# Patient Record
Sex: Female | Born: 1965 | Race: Black or African American | Hispanic: No | Marital: Married | Smoking: Never smoker
Health system: Southern US, Community
[De-identification: ages and names within clinical notes are randomized; demographics above are authoritative.]

## PROBLEM LIST (undated history)

## (undated) DIAGNOSIS — Z8719 Personal history of other diseases of the digestive system: Secondary | ICD-10-CM

## (undated) DIAGNOSIS — IMO0002 Reserved for concepts with insufficient information to code with codable children: Secondary | ICD-10-CM

## (undated) DIAGNOSIS — R200 Anesthesia of skin: Secondary | ICD-10-CM

## (undated) DIAGNOSIS — R202 Paresthesia of skin: Secondary | ICD-10-CM

## (undated) HISTORY — DX: Reserved for concepts with insufficient information to code with codable children: IMO0002

## (undated) HISTORY — DX: Personal history of other diseases of the digestive system: Z87.19

## (undated) HISTORY — DX: Anesthesia of skin: R20.2

## (undated) HISTORY — DX: Anesthesia of skin: R20.0

## (undated) HISTORY — PX: PAROTID GLAND TUMOR EXCISION: SHX5221

---

## 1993-10-17 DIAGNOSIS — IMO0002 Reserved for concepts with insufficient information to code with codable children: Secondary | ICD-10-CM

## 1993-10-17 HISTORY — DX: Reserved for concepts with insufficient information to code with codable children: IMO0002

## 1998-10-01 ENCOUNTER — Other Ambulatory Visit: Admission: RE | Admit: 1998-10-01 | Discharge: 1998-10-01 | Payer: Self-pay | Admitting: Obstetrics and Gynecology

## 1999-09-03 ENCOUNTER — Other Ambulatory Visit: Admission: RE | Admit: 1999-09-03 | Discharge: 1999-09-03 | Payer: Self-pay | Admitting: Obstetrics and Gynecology

## 2000-04-20 ENCOUNTER — Inpatient Hospital Stay (HOSPITAL_COMMUNITY): Admission: AD | Admit: 2000-04-20 | Discharge: 2000-04-22 | Payer: Self-pay | Admitting: Obstetrics and Gynecology

## 2000-09-11 ENCOUNTER — Other Ambulatory Visit: Admission: RE | Admit: 2000-09-11 | Discharge: 2000-09-11 | Payer: Self-pay | Admitting: Obstetrics and Gynecology

## 2002-01-24 ENCOUNTER — Other Ambulatory Visit: Admission: RE | Admit: 2002-01-24 | Discharge: 2002-01-24 | Payer: Self-pay | Admitting: Obstetrics and Gynecology

## 2003-03-07 ENCOUNTER — Other Ambulatory Visit: Admission: RE | Admit: 2003-03-07 | Discharge: 2003-03-07 | Payer: Self-pay | Admitting: Obstetrics and Gynecology

## 2004-05-23 ENCOUNTER — Other Ambulatory Visit: Admission: RE | Admit: 2004-05-23 | Discharge: 2004-05-23 | Payer: Self-pay | Admitting: Obstetrics and Gynecology

## 2005-05-28 ENCOUNTER — Other Ambulatory Visit: Admission: RE | Admit: 2005-05-28 | Discharge: 2005-05-28 | Payer: Self-pay | Admitting: Obstetrics and Gynecology

## 2005-06-19 ENCOUNTER — Encounter: Admission: RE | Admit: 2005-06-19 | Discharge: 2005-06-19 | Payer: Self-pay | Admitting: Obstetrics and Gynecology

## 2005-07-03 ENCOUNTER — Encounter: Admission: RE | Admit: 2005-07-03 | Discharge: 2005-07-03 | Payer: Self-pay | Admitting: Otolaryngology

## 2006-06-23 ENCOUNTER — Encounter: Admission: RE | Admit: 2006-06-23 | Discharge: 2006-06-23 | Payer: Self-pay | Admitting: Obstetrics and Gynecology

## 2007-09-15 ENCOUNTER — Encounter: Admission: RE | Admit: 2007-09-15 | Discharge: 2007-09-15 | Payer: Self-pay | Admitting: Obstetrics and Gynecology

## 2008-09-15 ENCOUNTER — Encounter: Admission: RE | Admit: 2008-09-15 | Discharge: 2008-09-15 | Payer: Self-pay | Admitting: Obstetrics and Gynecology

## 2008-09-19 ENCOUNTER — Encounter: Admission: RE | Admit: 2008-09-19 | Discharge: 2008-09-19 | Payer: Self-pay | Admitting: Obstetrics and Gynecology

## 2009-09-17 ENCOUNTER — Encounter: Admission: RE | Admit: 2009-09-17 | Discharge: 2009-09-17 | Payer: Self-pay | Admitting: Obstetrics and Gynecology

## 2009-09-24 ENCOUNTER — Encounter: Admission: RE | Admit: 2009-09-24 | Discharge: 2009-09-24 | Payer: Self-pay | Admitting: Obstetrics and Gynecology

## 2010-04-05 ENCOUNTER — Encounter: Admission: RE | Admit: 2010-04-05 | Discharge: 2010-04-05 | Payer: Self-pay | Admitting: Obstetrics and Gynecology

## 2010-06-08 ENCOUNTER — Encounter: Payer: Self-pay | Admitting: Obstetrics and Gynecology

## 2010-06-09 ENCOUNTER — Encounter: Payer: Self-pay | Admitting: Obstetrics and Gynecology

## 2010-10-04 NOTE — H&P (Signed)
[facility]  Patient:    Pamela Beck                       MRN: [id number] Adm. Date:  04/20/00 Attending:  Janine Limbo, M.D. Dictator:   Nigel Bridgeman, C.N.M.                         History and Physical  DATE OF BIRTH:                17-May-1966.  HISTORY OF PRESENT ILLNESS:   Pamela Beck is a 45 year old, gravida 2, para 1-0-0-1, at 37-3/7 weeks who presented from the office for ambulation and cervical recheck secondary to questionable early lab.  At the office, she was 2 to 3 cm, 80%, and vertex at -1 to -2 station.  She was contracting approximately three to four minutes at the office.  She reports positive bloody show.  Pregnancy has been remarkable for:  1) History of rapid labor. 2) Rubella nonimmune.  3) History of irritable bowel syndrome, but no current medications.  PRENATAL LABORATORY DATA:     Blood type is A positive, rh antibody negative, VDRL nonreactive, rubella titer nonimmune, hepatitis B surface antigen negative, HIV nonreactive, sickle cell test negative, Pap was normal, AFP was normal, glucose challenge was elevated, three-hour GTT was within normal limits.  Estimated due date of May 08, 2000, was established by last menstrual period and was in agreement with ultrasound at approximately 18 weeks.  Group B Strep culture was negative at 36 weeks.  HISTORY OF PRESENT PREGNANCY: The patient entered care at approximately seven weeks.  She had an essentially uncomplicated pregnancy.  She did have an elevation of her one-hour Glucola, but had a normal three-hour.  Her hemoglobin at entry into practice was 12.4, hemoglobin of 11.2 at 28 weeks. The rest of her pregnancy was essentially uncomplicated.  PAST OBSTETRIC HISTORY:       In 1996 she had a vaginal birth of a female infant, weight 7 pounds 8 ounces at [redacted] weeks gestation.  She was in labor approximately four to six hours.  She had vaginal delivery.  She had  no complications.  She was delivered by Dr. Stefano Gaul.  PAST MEDICAL HISTORY:         She was Alesse until October of 2000.  She had ASCUS on Pap in June of 1995 and had a colposcopy.  She had a history of a retroverted uterus.  She had occasional yeast infections and occasional BV. She has a history of irritable bowel syndrome, but has not been on any medications.  ALLERGIES:                    No known drug allergies.  FAMILY HISTORY:               Her mother has a history of spontaneous miscarriages.  Her father had a massive heart attack.  Her is hypertensive on medication.  Her father also has emphysema.  GENETIC HISTORY:              Unremarkable.  SOCIAL HISTORY:               The patient is married to the father of the baby. He is involved and supportive.  His name is Darlyn Read.  The patient is college educated.  She is employed as a Conservation officer, nature. Her  husband is also college educated and is employed as a Merchandiser, retail.  She is African-American, of the [employer].  She has been followed by the physician service of [facility].  She denies any alcohol, drug, or tobacco use during this pregnancy.  PHYSICAL EXAMINATION:  VITAL SIGNS:                  Stable.  The patient is afebrile.  HEENT:                        Within normal limits.  LUNGS:                        Bilateral breath sounds are clear.  HEART:                        Regular rate and rhythm without murmur.  BREASTS:                      Soft and nontender.  ABDOMEN:                      Fundal height is approximately 37 cm.  Estimated fetal weight is 6-1/2 to 7 pounds.  Uterine contractions are every three minutes of moderate quality.  PELVIC:                       Cervical examination after ambulation 4 to 5 cm, 80%, vertex at -2 station with slight bulging bag of water and a small amount of bloody show.  Fetal heart rate is reactive with no decellerations.   There is a negative spontaneous CST.  EXTREMITIES:                  Deep tendon reflexes are 2+ without clonus. There is a trace edema noted.  IMPRESSION:                   1. Intrauterine pregnancy at 37-3/7 weeks.                               2. Early active labor.                               3. Negative Group B Strep.                               4. Rubella nonimmune.  PLAN: 1. Admit to birthing suite per consult with Janine Limbo, M.D.    as attending physician. 2. Routine physician orders. 3. The patient prefers to utilize no pain medication, but if she does elect    to use pain medication, she will prefer to use epidural versus narcotics. 4. Anticipate normal spontaneous vaginal delivery. DD:  04/20/00 TD:  04/20/00 Job: [id number]

## 2010-12-31 ENCOUNTER — Emergency Department (HOSPITAL_COMMUNITY)
Admission: EM | Admit: 2010-12-31 | Discharge: 2011-01-01 | Disposition: A | Discharge status: Home / Self Care | Payer: 59 | Attending: Emergency Medicine | Admitting: Emergency Medicine

## 2010-12-31 ENCOUNTER — Emergency Department (HOSPITAL_COMMUNITY): Payer: 59

## 2010-12-31 DIAGNOSIS — R0602 Shortness of breath: Secondary | ICD-10-CM | POA: Insufficient documentation

## 2010-12-31 DIAGNOSIS — M542 Cervicalgia: Secondary | ICD-10-CM | POA: Insufficient documentation

## 2010-12-31 DIAGNOSIS — M25519 Pain in unspecified shoulder: Secondary | ICD-10-CM | POA: Insufficient documentation

## 2010-12-31 DIAGNOSIS — T733XXA Exhaustion due to excessive exertion, initial encounter: Secondary | ICD-10-CM | POA: Insufficient documentation

## 2010-12-31 DIAGNOSIS — IMO0002 Reserved for concepts with insufficient information to code with codable children: Secondary | ICD-10-CM | POA: Insufficient documentation

## 2011-01-10 ENCOUNTER — Other Ambulatory Visit: Payer: Self-pay | Admitting: Obstetrics and Gynecology

## 2011-01-10 DIAGNOSIS — N63 Unspecified lump in unspecified breast: Secondary | ICD-10-CM

## 2011-12-05 ENCOUNTER — Encounter: Payer: Self-pay | Admitting: Obstetrics and Gynecology

## 2011-12-10 ENCOUNTER — Ambulatory Visit (INDEPENDENT_AMBULATORY_CARE_PROVIDER_SITE_OTHER): Payer: 59 | Admitting: Obstetrics and Gynecology

## 2011-12-10 ENCOUNTER — Encounter: Payer: Self-pay | Admitting: Obstetrics and Gynecology

## 2011-12-10 VITALS — BP 92/60 | Resp 14 | Ht 64.0 in | Wt 131.0 lb

## 2011-12-10 DIAGNOSIS — Z124 Encounter for screening for malignant neoplasm of cervix: Secondary | ICD-10-CM

## 2011-12-10 DIAGNOSIS — K5900 Constipation, unspecified: Secondary | ICD-10-CM

## 2011-12-10 DIAGNOSIS — Z01419 Encounter for gynecological examination (general) (routine) without abnormal findings: Secondary | ICD-10-CM

## 2011-12-10 MED ORDER — ETONOGESTREL-ETHINYL ESTRADIOL 0.12-0.015 MG/24HR VA RING: VAGINAL_RING | VAGINAL | Status: DC

## 2011-12-10 NOTE — Progress Notes (Addendum)
Regular Periods: yes Mammogram: no   Monthly Breast Ex.: yes Exercise: yes  Tetanus < 10 years: yes Seatbelts: yes  NI. Bladder Functn.: yes Abuse at home: no  Daily BM's: no Stressful Work: no  Healthy Diet: yes Sigmoid-Colonoscopy: N/A  Calcium: no Medical problems this year: N/A   LAST PAP:11/14/2010 "WNL"  Contraception: Nuvaring  Mammogram: 2011  PCP: in the process of finding a new PCP.  PMH: No changes  FMH: No changes  Last Bone Scan: N/A  ANNUAL GYNECOLOGIC EXAMINATION   Pamela Beck is a 46 y.o. female, [id number], who presents for an annual exam. See above. The patient has a history of constipation.  She reports that her bowels are soft just difficult to come out.  She is happy with Nuvaring.  Prior Hysterectomy: No    History   Social History  . Marital Status: Married    Spouse Name: N/A    Number of Children: N/A  . Years of Education: N/A   Social History Main Topics  . Smoking status: Never Smoker   . Smokeless tobacco: Never Used  . Alcohol Use: No  . Drug Use: No  . Sexually Active: Yes    Birth Control/ Protection: Inserts   Other Topics Concern  . None   Social History Narrative  . None    Menstrual cycle:   LMP: Patient's last menstrual period was 12/07/2011.           Cycle: Regular, monthly with normal flow and no severe dysmenorrha  The following portions of the patient's history were reviewed and updated as appropriate: allergies, current medications, past family history, past medical history, past social history, past surgical history and problem list.  Review of Systems Pertinent items are noted in HPI. Breast:Negative for breast lump,nipple discharge or nipple retraction Gastrointestinal: Negative for abdominal pain, change in bowel habits or rectal bleeding Urinary:negative   Objective:    BP 92/60  Resp 14  Ht 5\' 4"  (1.626 m)  Wt 131 lb (59.421 kg)  BMI 22.49 kg/m2  LMP 12/07/2011    Weight:  Wt Readings from Last  1 Encounters:  12/10/11 131 lb (59.421 kg)          BMI: Body mass index is 22.49 kg/(m^2).  General Appearance: Alert, appropriate appearance for age. No acute distress HEENT: Grossly normal Neck / Thyroid: Supple, no masses, nodes or enlargement Lungs: clear to auscultation bilaterally Back: No CVA tenderness Breast Exam: No masses or nodes.No dimpling, nipple retraction or discharge. Cardiovascular: Regular rate and rhythm. S1, S2, no murmur Gastrointestinal: Soft, non-tender, no masses or organomegaly  ++++++++++++++++++++++++++++++++++++++++++++++++++++++++  Pelvic Exam: External genitalia: normal general appearance Vaginal: normal without tenderness, induration or masses and relaxation: Yes Cervix: normal appearance Adnexa: normal bimanual exam Uterus: normal size, shape, and consistency Rectovaginal: normal rectal, no masses  ++++++++++++++++++++++++++++++++++++++++++++++++++++++++  Lymphatic Exam: Non-palpable nodes in neck, clavicular, axillary, or inguinal regions Neurologic: Normal speech, no tremor  Psychiatric: Alert and oriented, appropriate affect.   Wet Prep:   not applicable Urinalysis:  not applicable UPT:           Not done   Assessment:    Normal gyn exam   Overweight or obese: No   Pelvic relaxation: Yes  constipation   Plan:    mammogram pap smear return annually or prn Contraception:NuvaRing vaginal inserts    Medications prescribed: none  STD screen request: none  RPR: No.   HBsAg: No.  Hepatitis C: No.  The updated Pap  smear screening guidelines were discussed with the patient. The patient requested that I obtain a Pap smear: yes.  Kegel exercises discussed: Yes.  Proper diet and regular exercise were reviewed.  Annual mammograms recommended starting at age 74. Proper breast care was discussed.  Screening colonoscopy is recommended beginning at age 38.  Regular health maintenance was reviewed.  Sleep hygiene was  discussed.  Adequate calcium and vitamin D intake was emphasized.  Refer patient to a gastroenterologist.  Mylinda Latina.D.

## 2011-12-11 LAB — PAP IG W/ RFLX HPV ASCU

## 2011-12-15 ENCOUNTER — Other Ambulatory Visit: Payer: Self-pay | Admitting: Obstetrics and Gynecology

## 2011-12-15 DIAGNOSIS — N63 Unspecified lump in unspecified breast: Secondary | ICD-10-CM

## 2011-12-18 ENCOUNTER — Other Ambulatory Visit: Payer: 59

## 2011-12-19 ENCOUNTER — Other Ambulatory Visit: Payer: 59

## 2011-12-29 ENCOUNTER — Ambulatory Visit
Admission: RE | Admit: 2011-12-29 | Discharge: 2011-12-29 | Disposition: A | Discharge status: Home / Self Care | Payer: 59 | Source: Ambulatory Visit | Attending: Obstetrics and Gynecology | Admitting: Obstetrics and Gynecology

## 2011-12-29 DIAGNOSIS — N63 Unspecified lump in unspecified breast: Secondary | ICD-10-CM

## 2012-01-21 ENCOUNTER — Other Ambulatory Visit: Payer: Self-pay | Admitting: Obstetrics and Gynecology

## 2012-01-22 NOTE — Telephone Encounter (Signed)
Tc to pt's mail order pharmacy.  Rx for Nuvaring called to [facility] [phone]).  Pt made aware.

## 2013-02-03 ENCOUNTER — Other Ambulatory Visit: Payer: Self-pay

## 2013-02-03 DIAGNOSIS — Z1231 Encounter for screening mammogram for malignant neoplasm of breast: Secondary | ICD-10-CM

## 2013-02-18 ENCOUNTER — Ambulatory Visit
Admission: RE | Admit: 2013-02-18 | Discharge: 2013-02-18 | Disposition: A | Discharge status: Home / Self Care | Payer: 59 | Source: Ambulatory Visit

## 2013-02-18 DIAGNOSIS — Z1231 Encounter for screening mammogram for malignant neoplasm of breast: Secondary | ICD-10-CM

## 2014-03-20 ENCOUNTER — Encounter: Payer: Self-pay | Admitting: Obstetrics and Gynecology

## 2014-08-10 ENCOUNTER — Other Ambulatory Visit: Payer: Self-pay

## 2014-08-10 DIAGNOSIS — Z1231 Encounter for screening mammogram for malignant neoplasm of breast: Secondary | ICD-10-CM

## 2014-08-18 ENCOUNTER — Ambulatory Visit: Payer: Self-pay

## 2014-08-21 ENCOUNTER — Ambulatory Visit: Payer: Self-pay

## 2014-08-30 ENCOUNTER — Ambulatory Visit
Admission: RE | Admit: 2014-08-30 | Discharge: 2014-08-30 | Disposition: A | Discharge status: Home / Self Care | Payer: 59 | Source: Ambulatory Visit

## 2014-08-30 DIAGNOSIS — Z1231 Encounter for screening mammogram for malignant neoplasm of breast: Secondary | ICD-10-CM

## 2015-07-30 ENCOUNTER — Other Ambulatory Visit: Payer: Self-pay

## 2015-07-30 DIAGNOSIS — Z1231 Encounter for screening mammogram for malignant neoplasm of breast: Secondary | ICD-10-CM

## 2015-09-10 ENCOUNTER — Ambulatory Visit: Payer: 59

## 2015-10-03 ENCOUNTER — Ambulatory Visit: Payer: 59

## 2015-11-05 ENCOUNTER — Ambulatory Visit
Admission: RE | Admit: 2015-11-05 | Discharge: 2015-11-05 | Disposition: A | Discharge status: Home / Self Care | Payer: 59 | Source: Ambulatory Visit

## 2015-11-05 DIAGNOSIS — Z1231 Encounter for screening mammogram for malignant neoplasm of breast: Secondary | ICD-10-CM

## 2016-12-16 ENCOUNTER — Other Ambulatory Visit: Payer: Self-pay | Admitting: Obstetrics and Gynecology

## 2016-12-16 DIAGNOSIS — Z1231 Encounter for screening mammogram for malignant neoplasm of breast: Secondary | ICD-10-CM

## 2017-01-05 ENCOUNTER — Ambulatory Visit
Admission: RE | Admit: 2017-01-05 | Discharge: 2017-01-05 | Disposition: A | Discharge status: Home / Self Care | Payer: 59 | Source: Ambulatory Visit | Attending: Obstetrics and Gynecology | Admitting: Obstetrics and Gynecology

## 2017-01-05 DIAGNOSIS — Z1231 Encounter for screening mammogram for malignant neoplasm of breast: Secondary | ICD-10-CM

## 2018-01-26 ENCOUNTER — Other Ambulatory Visit: Payer: Self-pay | Admitting: Obstetrics and Gynecology

## 2018-01-26 DIAGNOSIS — Z1231 Encounter for screening mammogram for malignant neoplasm of breast: Secondary | ICD-10-CM

## 2018-02-23 ENCOUNTER — Ambulatory Visit
Admission: RE | Admit: 2018-02-23 | Discharge: 2018-02-23 | Disposition: A | Discharge status: Home / Self Care | Payer: 59 | Source: Ambulatory Visit | Attending: Obstetrics and Gynecology | Admitting: Obstetrics and Gynecology

## 2018-02-23 DIAGNOSIS — Z1231 Encounter for screening mammogram for malignant neoplasm of breast: Secondary | ICD-10-CM

## 2018-04-12 ENCOUNTER — Ambulatory Visit: Payer: 59 | Admitting: Neurology

## 2018-04-12 ENCOUNTER — Encounter: Payer: Self-pay | Admitting: Neurology

## 2018-04-12 VITALS — BP 108/70 | HR 72 | Ht 63.25 in | Wt 147.5 lb

## 2018-04-12 DIAGNOSIS — R200 Anesthesia of skin: Secondary | ICD-10-CM | POA: Diagnosis not present

## 2018-04-12 DIAGNOSIS — R202 Paresthesia of skin: Secondary | ICD-10-CM | POA: Diagnosis not present

## 2018-04-12 MED ORDER — LIDOCAINE-PRILOCAINE 2.5-2.5 % EX CREA: 1.0000 "application " | TOPICAL_CREAM | CUTANEOUS | 3 refills | Status: AC | PRN

## 2018-04-12 NOTE — Progress Notes (Signed)
PATIENT: Pamela Beck DOB: Nov 16, 1965  Chief Complaint  Patient presents with  . Numbness    Reports numbness/tingling in her left, outer thigh and fingertips bilaterally.  Her thigh is sore to touch. Her fingers also feel stiff.  Symptoms started approximately one month ago.   Pamela Beck PCP    Jarrett Soho, PA-C     HISTORICAL  Pamela Beck is of 52 years old female, seen in request by her primary care PA Jarrett Soho, for evaluation of finger numbness, tingling, left paresthesia, initial evaluation was on April 12, 2018.  I have reviewed and summarized the referring note from the referring physician.  She has past medical history of migraine headaches, works at a desk job, typing,  Since October 2019, she noticed intermittent fingertips numbness tingling, involving all 5 fingers, right worse than left, she feels fingertip stiffness, no significant weakness, she denies neck pain, she also complains of left lateral thigh area paresthesia, sore to touch, denies gait difficulty, she walks on treadmill regularly, mild midline low back pain no radiating pain, no bowel and bladder incontinence.  She denies neck pain,   Laboratory evaluation showed mild saturation of 16 normal BMP creatinine of 0.8, with exception of mild elevated AST 54, ALT 67, globin 13.3, vitamin B12 323   REVIEW OF SYSTEMS: Full 14 system review of systems performed and notable only for weight gain, feeling hot, numbness All other review of systems were negative.  ALLERGIES: No Known Allergies  HOME MEDICATIONS: No current outpatient medications on file.   No current facility-administered medications for this visit.     PAST MEDICAL HISTORY: Past Medical History:  Diagnosis Date  . Abnormal Pap smear 10/1993   ASCUS  . History of IBS   . Numbness and tingling     PAST SURGICAL HISTORY: Past Surgical History:  Procedure Laterality Date  . PAROTID GLAND TUMOR EXCISION     benign     FAMILY HISTORY: Family History  Problem Relation Age of Onset  . Hypertension Mother   . Lung cancer Mother   . Heart attack Father   . Emphysema Father   . Breast cancer Neg Hx     SOCIAL HISTORY: Social History   Socioeconomic History  . Marital status: Married    Spouse name: Not on file  . Number of children: 3  . Years of education: college  . Highest education level: Bachelor's degree (e.g., BA, AB, BS)  Occupational History  . Occupation: Optician, dispensing  Social Needs  . Financial resource strain: Not on file  . Food insecurity:    Worry: Not on file    Inability: Not on file  . Transportation needs:    Medical: Not on file    Non-medical: Not on file  Tobacco Use  . Smoking status: Never Smoker  . Smokeless tobacco: Never Used  Substance and Sexual Activity  . Alcohol use: No  . Drug use: No  . Sexual activity: Yes    Birth control/protection: Inserts  Lifestyle  . Physical activity:    Days per week: Not on file    Minutes per session: Not on file  . Stress: Not on file  Relationships  . Social connections:    Talks on phone: Not on file    Gets together: Not on file    Attends religious service: Not on file    Active member of club or organization: Not on file    Attends meetings of clubs or  organizations: Not on file    Relationship status: Not on file  . Intimate partner violence:    Fear of current or ex partner: Not on file    Emotionally abused: Not on file    Physically abused: Not on file    Forced sexual activity: Not on file  Other Topics Concern  . Not on file  Social History Narrative   Lives at home with husband and children.   Right-handed.   1-2 cups caffeine per day.     PHYSICAL EXAM   Vitals:   04/12/18 1417  BP: 108/70  Pulse: 72  Weight: 147 lb 8 oz (66.9 kg)  Height: 5' 3.25" (1.607 m)    Not recorded      Body mass index is 25.92 kg/m.  PHYSICAL EXAMNIATION:  Gen: NAD, conversant, well nourised, obese,  well groomed                     Cardiovascular: Regular rate rhythm, no peripheral edema, warm, nontender. Eyes: Conjunctivae clear without exudates or hemorrhage Neck: Supple, no carotid bruits. Pulmonary: Clear to auscultation bilaterally   NEUROLOGICAL EXAM:  MENTAL STATUS: Speech:    Speech is normal; fluent and spontaneous with normal comprehension.  Cognition:     Orientation to time, place and person     Normal recent and remote memory     Normal Attention span and concentration     Normal Language, naming, repeating,spontaneous speech     Fund of knowledge   CRANIAL NERVES: CN II: Visual fields are full to confrontation. Fundoscopic exam is normal with sharp discs and no vascular changes. Pupils are round equal and briskly reactive to light. CN III, IV, VI: extraocular movement are normal. No ptosis. CN V: Facial sensation is intact to pinprick in all 3 divisions bilaterally. Corneal responses are intact.  CN VII: Face is symmetric with normal eye closure and smile. CN VIII: Hearing is normal to rubbing fingers CN IX, X: Palate elevates symmetrically. Phonation is normal. CN XI: Head turning and shoulder shrug are intact CN XII: Tongue is midline with normal movements and no atrophy.  MOTOR: There is no pronator drift of out-stretched arms. Muscle bulk and tone are normal. Muscle strength is normal.  REFLEXES: Reflexes are 2+ and symmetric at the biceps, triceps, knees, and ankles. Plantar responses are flexor.  SENSORY: Intact to light touch, pinprick, positional sensation and vibratory sensation are intact in fingers and toes.  COORDINATION: Rapid alternating movements and fine finger movements are intact. There is no dysmetria on finger-to-nose and heel-knee-shin.    GAIT/STANCE: Posture is normal. Gait is steady with normal steps, base, arm swing, and turning. Heel and toe walking are normal. Tandem gait is normal.  Romberg is absent.   DIAGNOSTIC DATA (LABS,  IMAGING, TESTING) - I reviewed patient records, labs, notes, testing and imaging myself where available.   ASSESSMENT AND PLAN  [name] is a 52 y.o. 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 Ph.D.  [facility] [address], [address] [place], [place] [address] Ph: [phone] Fax: [phone]  CC: Jarrett SohoWharton, Courtney, PA-C

## 2018-05-28 ENCOUNTER — Encounter: Payer: 59 | Admitting: Neurology

## 2018-07-21 ENCOUNTER — Telehealth: Payer: Self-pay | Admitting: Neurology

## 2018-07-26 NOTE — Telephone Encounter (Signed)
error 

## 2018-08-31 ENCOUNTER — Telehealth: Payer: Self-pay

## 2018-08-31 MED ORDER — DICLOFENAC SODIUM 1 % TD GEL: 4.0000 g | Freq: Four times a day (QID) | TRANSDERMAL | 6 refills | Status: AC | PRN

## 2018-08-31 NOTE — Telephone Encounter (Signed)
I called and spoke with patient, Due to current COVID 19 pandemic, our office is severely reducing in office visits for at least the next 2 weeks, in order to minimize the risk to our patients and healthcare providers.   Pt understands and is agreeable. She is asking if Dr. Terrace Arabia has any recommendation for her wrist pain until we can get this test done. She says that it only hurts when she moves it a certain way and she has tried wrist splints but it has not helped.

## 2018-08-31 NOTE — Telephone Encounter (Signed)
I called and spoke with patient and made her aware of Dr. Zannie Cove recommendations. She was very Adult nurse. I will call her back in a couple of weeks to reschedule her NCV/EMG

## 2018-08-31 NOTE — Addendum Note (Signed)
Addended by: Levert Feinstein on: 08/31/2018 09:28 AM   Modules accepted: Orders

## 2018-08-31 NOTE — Telephone Encounter (Addendum)
She may also try heating pad, massage, NSAIDs as needed. I also called in Diclofenac gel as needed  If her main concern now is wrist pain, not finger paresthesia, may consider work with her PCP or even orthopedics to check on her wrist.

## 2018-09-06 ENCOUNTER — Encounter: Payer: Self-pay | Admitting: Neurology

## 2018-11-24 ENCOUNTER — Encounter (INDEPENDENT_AMBULATORY_CARE_PROVIDER_SITE_OTHER): Payer: No Typology Code available for payment source | Admitting: Neurology

## 2018-11-24 ENCOUNTER — Other Ambulatory Visit: Payer: Self-pay

## 2018-11-24 ENCOUNTER — Ambulatory Visit (INDEPENDENT_AMBULATORY_CARE_PROVIDER_SITE_OTHER): Payer: No Typology Code available for payment source | Admitting: Neurology

## 2018-11-24 DIAGNOSIS — R202 Paresthesia of skin: Secondary | ICD-10-CM

## 2018-11-24 DIAGNOSIS — Z0289 Encounter for other administrative examinations: Secondary | ICD-10-CM

## 2018-11-24 NOTE — Procedures (Signed)
Full Name: Pamela Beck Gender: Female MRN #: [id number] Date of Birth: 06/13/1965    Visit Date: 11/24/2018 10:21 Age: 53 Years 3 Months Old Examining Physician: Levert FeinsteinYijun Leanna Hamid, MD  Referring Physician: Levert FeinsteinYijun Rainen Vanrossum, MD History:   53 years old female presented with intermittent fingers paresthesia and difficulty to make a tight fist.  Summary of the tests: Nerve conduction study: Left Median sensory and motor responses were normal. Right median sensory response showed mildly prolonged peak latency with normal snap amplitude.  Right median motor responses were normal.  Right ulnar sensory and motor responses were normal.  Electromyography: Selective needle examination of right upper extremity muscles were normal.    Conclusion: This is an abnormal study.  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    ------------------------------- Levert FeinsteinYIjun Iyanla Eilers, M.D. PhD  [facility] [address] [place], [place] [address] Tel: [phone] Fax: [phone]        [facility]    Nerve / Sites Muscle Latency Ref. Amplitude Ref. Rel Amp Segments Distance Velocity Ref. Area    ms ms mV mV %  cm m/s m/s mVms  L Median - APB     Wrist APB 3.6 ?4.4 6.7 ?4.0 100 Wrist - APB 7   24.1     Upper arm APB 7.7  6.7  99.1 Upper arm - Wrist 21 51 ?49 24.2  R Median - APB     Wrist APB 4.0 ?4.4 8.9 ?4.0 100 Wrist - APB 7   26.8     Upper arm APB 8.0  8.5  95.4 Upper arm - Wrist 21 52 ?49 26.0  L Ulnar - ADM     Wrist ADM 2.6 ?3.3 10.7 ?6.0 100 Wrist - ADM 7   28.9     B.Elbow ADM 6.1  10.0  92.9 B.Elbow - Wrist 18 51 ?49 28.3     A.Elbow ADM 8.1  9.7  97.4 A.Elbow - B.Elbow 10 49 ?49 28.5         A.Elbow - Wrist      R Ulnar - ADM     Wrist ADM 2.7 ?3.3 13.6 ?6.0 100 Wrist - ADM 7   35.0     B.Elbow ADM 5.9  12.0  88.7 B.Elbow - Wrist 17 52 ?49 34.5     A.Elbow ADM 7.9  10.7  89 A.Elbow -  B.Elbow 10 52 ?49 33.3         A.Elbow - Wrist                 SNC    Nerve / Sites Rec. Site Peak Lat Ref.  Amp Ref. Segments Distance Peak Diff Ref.    ms ms V V  cm ms ms  L Median, Ulnar - Transcarpal comparison     Median Palm Wrist 2.3 ?2.2 36 ?35 Median Palm - Wrist 8       Ulnar Palm Wrist 2.1 ?2.2 13 ?12 Ulnar Palm - Wrist 8          Median Palm - Ulnar Palm  0.3 ?0.4  R Median, Ulnar - Transcarpal comparison     Median Palm Wrist 2.8 ?2.2 52 ?35 Median Palm - Wrist 8       Ulnar Palm Wrist 1.9 ?2.2 20 ?12 Ulnar Palm - Wrist 8          Median Palm - Humana IncUlnar Palm  0.8 ?0.4  L Median - Orthodromic (Dig II, Mid palm)     Dig II Wrist 3.4 ?3.4 13 ?10 Dig II - Wrist 13    R Median - Orthodromic (Dig II, Mid palm)     Dig II Wrist 3.6 ?3.4 10 ?10 Dig II - Wrist 13    L Ulnar - Orthodromic, (Dig V, Mid palm)     Dig V Wrist 2.9 ?3.1 10 ?5 Dig V - Wrist 11    R Ulnar - Orthodromic, (Dig V, Mid palm)     Dig V Wrist 2.6 ?3.1 11 ?5 Dig V - Wrist 67                   F  Wave    Nerve F Lat Ref.   ms ms  L Ulnar - ADM 26.6 ?32.0  R Ulnar - ADM 27.1 ?32.0         EMG       EMG Summary Table    Spontaneous MUAP Recruitment  Muscle IA Fib PSW Fasc Other Amp Dur. Poly Pattern  R. First dorsal interosseous Normal None None None _______ Normal Normal Normal Normal  R. Pronator teres Normal None None None _______ Normal Normal Normal Normal  R. Biceps brachii Normal None None None _______ Normal Normal Normal Normal  R. Deltoid Normal None None None _______ Normal Normal Normal Normal  R. Triceps brachii Normal None None None _______ Normal Normal Normal Normal  R. Extensor digitorum communis Normal None None None _______ Normal Normal Normal Normal

## 2019-02-14 ENCOUNTER — Other Ambulatory Visit: Payer: Self-pay | Admitting: Family Medicine

## 2019-02-14 DIAGNOSIS — Z1231 Encounter for screening mammogram for malignant neoplasm of breast: Secondary | ICD-10-CM

## 2019-04-01 ENCOUNTER — Other Ambulatory Visit: Payer: Self-pay

## 2019-04-01 ENCOUNTER — Ambulatory Visit
Admission: RE | Admit: 2019-04-01 | Discharge: 2019-04-01 | Disposition: A | Discharge status: Home / Self Care | Payer: No Typology Code available for payment source | Source: Ambulatory Visit | Attending: Family Medicine | Admitting: Family Medicine

## 2019-04-01 DIAGNOSIS — Z1231 Encounter for screening mammogram for malignant neoplasm of breast: Secondary | ICD-10-CM

## 2020-02-29 ENCOUNTER — Other Ambulatory Visit: Payer: Self-pay | Admitting: Family Medicine

## 2020-02-29 DIAGNOSIS — Z1231 Encounter for screening mammogram for malignant neoplasm of breast: Secondary | ICD-10-CM

## 2020-04-02 ENCOUNTER — Other Ambulatory Visit: Payer: Self-pay

## 2020-04-02 ENCOUNTER — Ambulatory Visit
Admission: RE | Admit: 2020-04-02 | Discharge: 2020-04-02 | Disposition: A | Discharge status: Home / Self Care | Payer: No Typology Code available for payment source | Source: Ambulatory Visit | Attending: Family Medicine | Admitting: Family Medicine

## 2020-04-02 DIAGNOSIS — Z1231 Encounter for screening mammogram for malignant neoplasm of breast: Secondary | ICD-10-CM

## 2020-08-03 ENCOUNTER — Other Ambulatory Visit: Payer: Self-pay

## 2020-08-03 ENCOUNTER — Ambulatory Visit
Admission: RE | Admit: 2020-08-03 | Discharge: 2020-08-03 | Disposition: A | Discharge status: Home / Self Care | Payer: No Typology Code available for payment source | Source: Ambulatory Visit | Attending: Family Medicine | Admitting: Family Medicine

## 2020-08-03 ENCOUNTER — Other Ambulatory Visit: Payer: Self-pay | Admitting: Family Medicine

## 2020-08-03 DIAGNOSIS — R52 Pain, unspecified: Secondary | ICD-10-CM

## 2021-02-18 ENCOUNTER — Other Ambulatory Visit: Payer: Self-pay | Admitting: Family Medicine

## 2021-02-18 DIAGNOSIS — Z1231 Encounter for screening mammogram for malignant neoplasm of breast: Secondary | ICD-10-CM

## 2021-04-03 ENCOUNTER — Ambulatory Visit
Admission: RE | Admit: 2021-04-03 | Discharge: 2021-04-03 | Disposition: A | Discharge status: Home / Self Care | Payer: No Typology Code available for payment source | Source: Ambulatory Visit | Attending: Family Medicine | Admitting: Family Medicine

## 2021-04-03 DIAGNOSIS — Z1231 Encounter for screening mammogram for malignant neoplasm of breast: Secondary | ICD-10-CM

## 2021-10-29 ENCOUNTER — Other Ambulatory Visit: Payer: Self-pay

## 2021-10-29 MED ORDER — WEGOVY 0.25 MG/0.5ML ~~LOC~~ SOAJ
Freq: Every day | SUBCUTANEOUS | 1 refills | Status: AC
  Filled 2021-10-29: qty 4, 28d supply, fill #0
  Filled 2021-10-30: qty 2, 28d supply, fill #0

## 2021-10-30 ENCOUNTER — Other Ambulatory Visit (HOSPITAL_COMMUNITY): Payer: Self-pay

## 2021-10-31 ENCOUNTER — Other Ambulatory Visit (HOSPITAL_COMMUNITY): Payer: Self-pay

## 2021-12-11 ENCOUNTER — Other Ambulatory Visit (HOSPITAL_COMMUNITY): Payer: Self-pay

## 2021-12-12 ENCOUNTER — Other Ambulatory Visit (HOSPITAL_COMMUNITY): Payer: Self-pay

## 2021-12-20 ENCOUNTER — Other Ambulatory Visit (HOSPITAL_COMMUNITY): Payer: Self-pay

## 2022-05-21 ENCOUNTER — Other Ambulatory Visit: Payer: Self-pay | Admitting: Family Medicine

## 2022-05-21 DIAGNOSIS — Z1231 Encounter for screening mammogram for malignant neoplasm of breast: Secondary | ICD-10-CM

## 2022-07-08 ENCOUNTER — Ambulatory Visit: Payer: No Typology Code available for payment source

## 2022-08-19 ENCOUNTER — Ambulatory Visit
Admission: RE | Admit: 2022-08-19 | Discharge: 2022-08-19 | Disposition: A | Discharge status: Home / Self Care | Payer: 59 | Source: Ambulatory Visit | Attending: Family Medicine | Admitting: Family Medicine

## 2022-08-19 ENCOUNTER — Encounter: Payer: Self-pay | Admitting: Radiology

## 2022-08-19 DIAGNOSIS — Z1231 Encounter for screening mammogram for malignant neoplasm of breast: Secondary | ICD-10-CM

## 2023-01-06 IMAGING — MG MM DIGITAL SCREENING BILAT W/ TOMO AND CAD
8 series · 9 of 24 positions shown · non-contrast
Comparison: Previous exam(s).

CLINICAL DATA: Screening.

EXAM:
DIGITAL SCREENING BILATERAL MAMMOGRAM WITH TOMOSYNTHESIS AND CAD
TECHNIQUE: Bilateral screening digital craniocaudal and mediolateral oblique
mammograms were obtained. Bilateral screening digital breast
tomosynthesis was performed. The images were evaluated with
computer-aided detection.

[R MLO synth-2D]
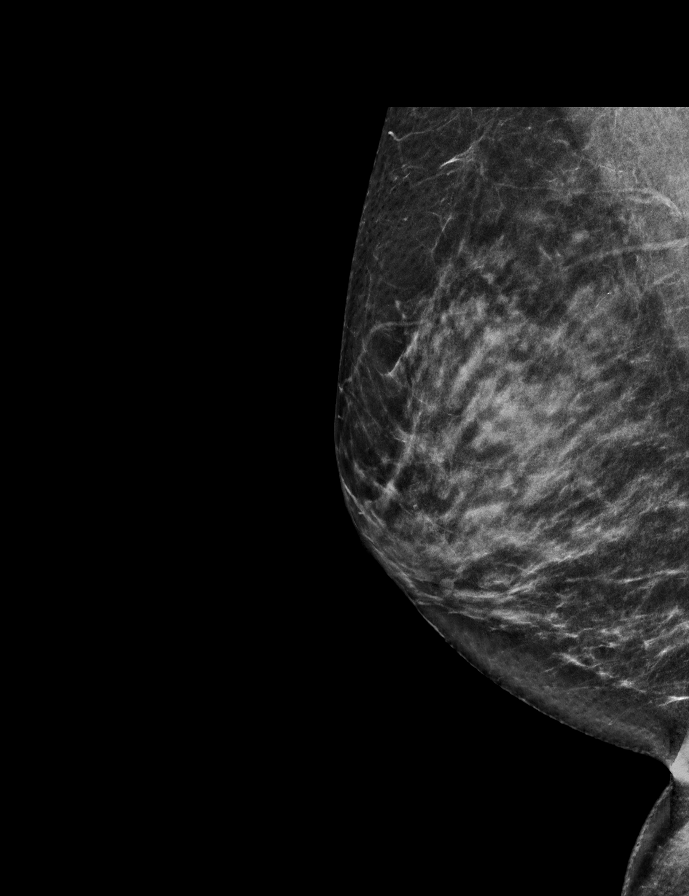

[R CC synth-2D]
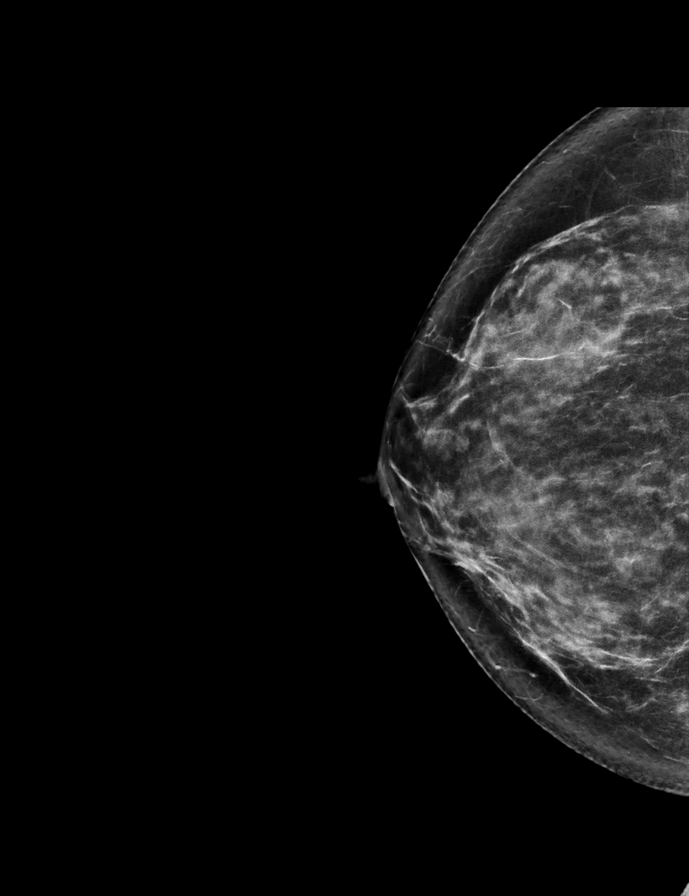

[L MLO synth-2D]
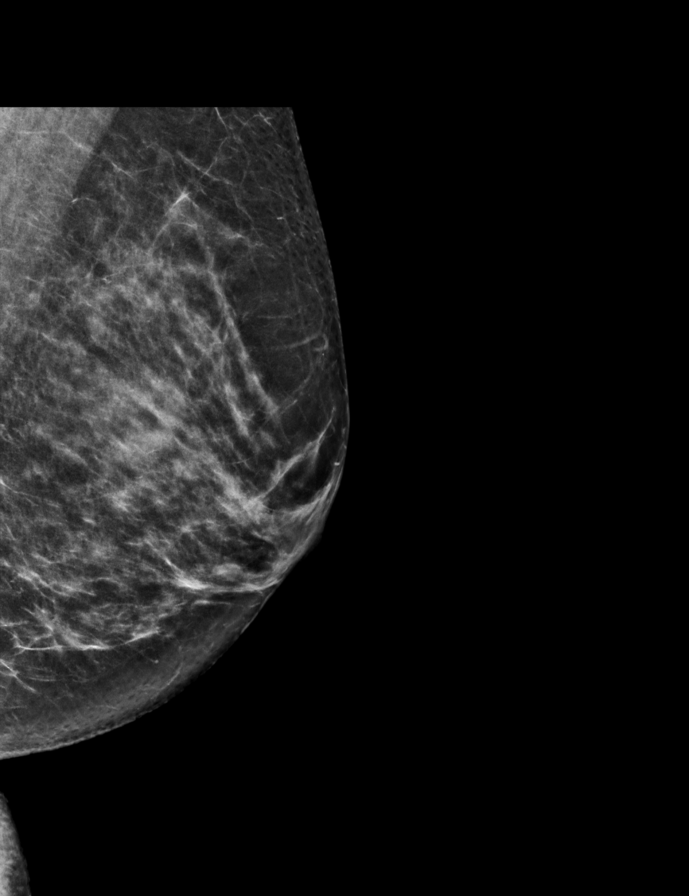

[L CC synth-2D]
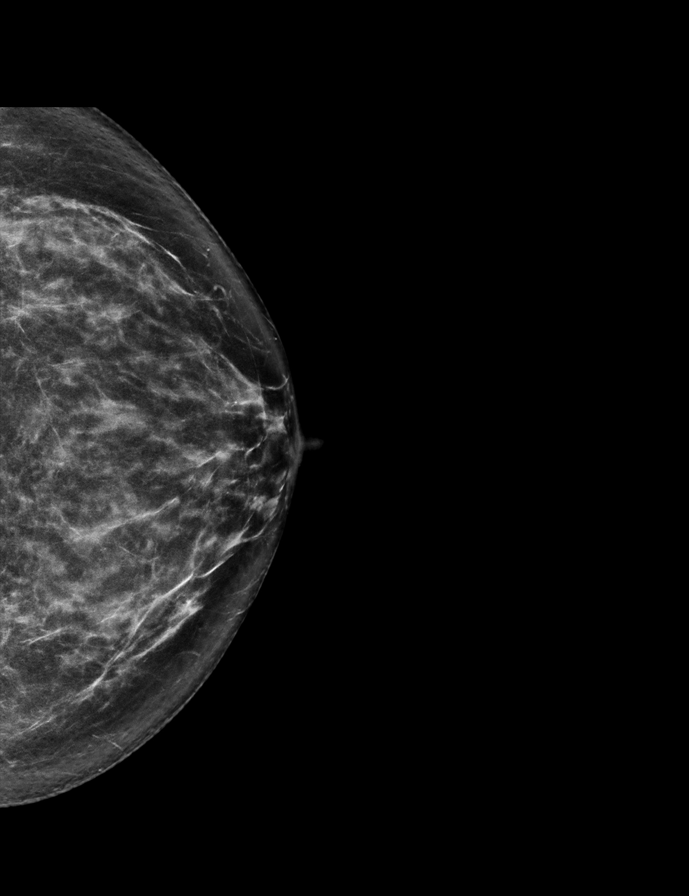

[L CC tomo · 2 of 68 frames shown]
[frame 22/68]
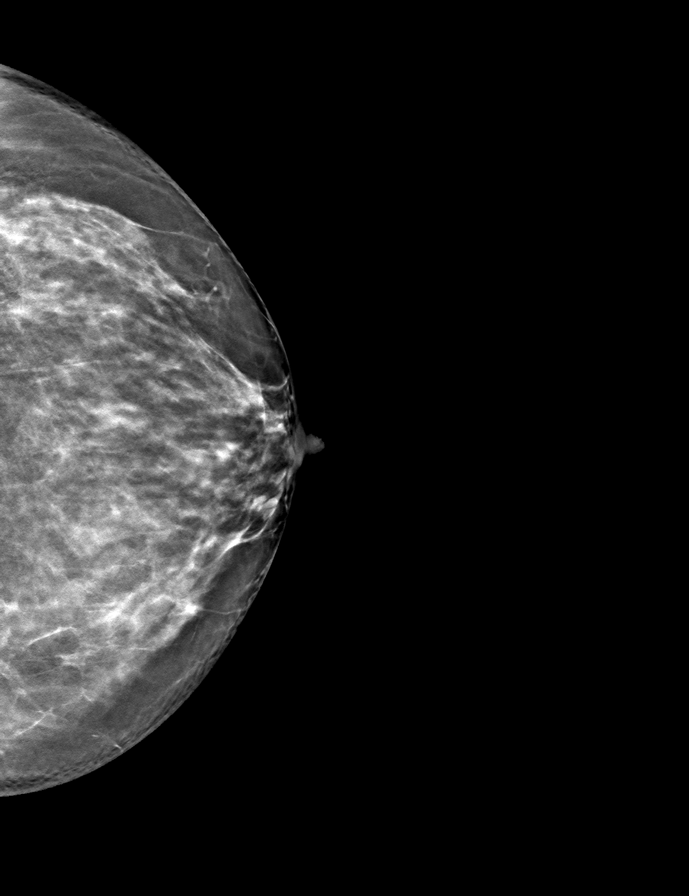
[frame 35/68]
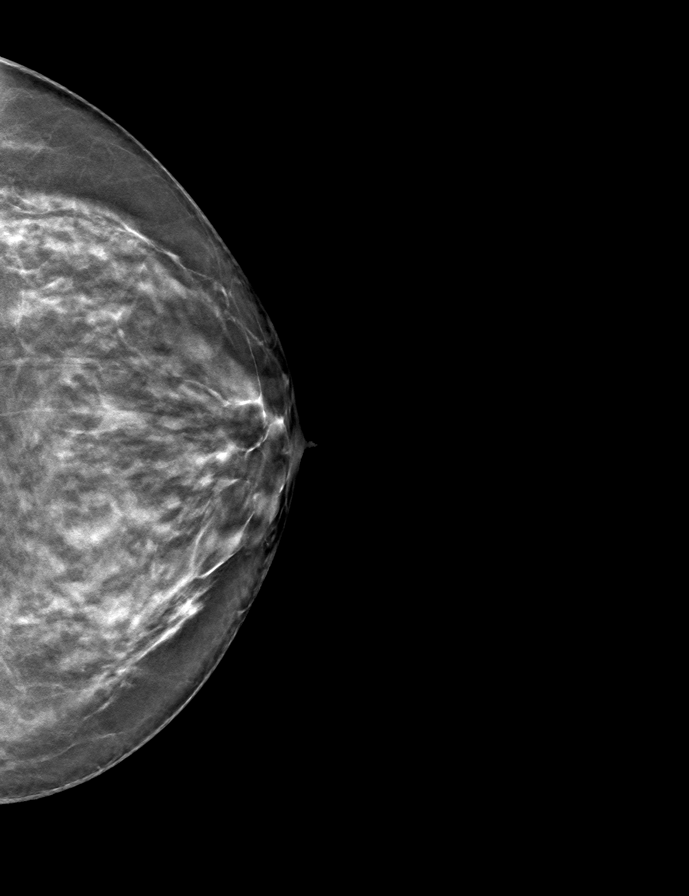

[R CC tomo · tomo slice 35/69.0]
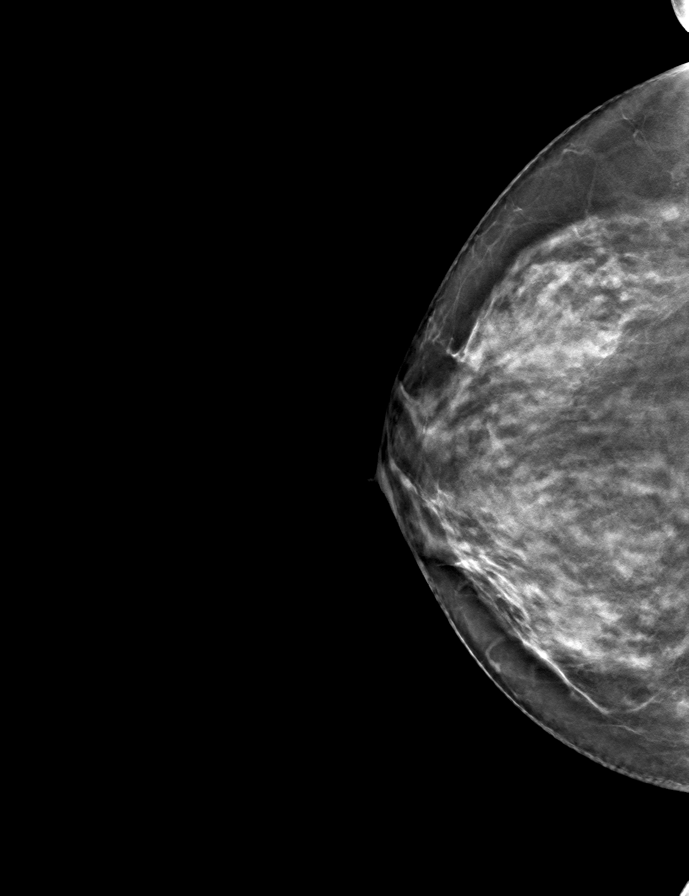

[R MLO tomo · tomo slice 33/65.0]
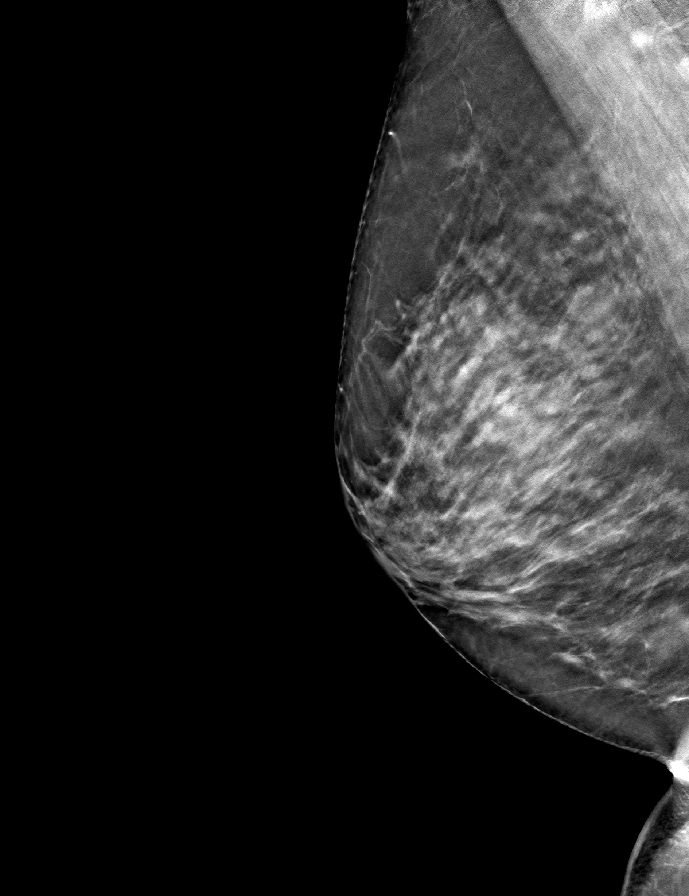

[L MLO tomo · tomo slice 34/67.0]
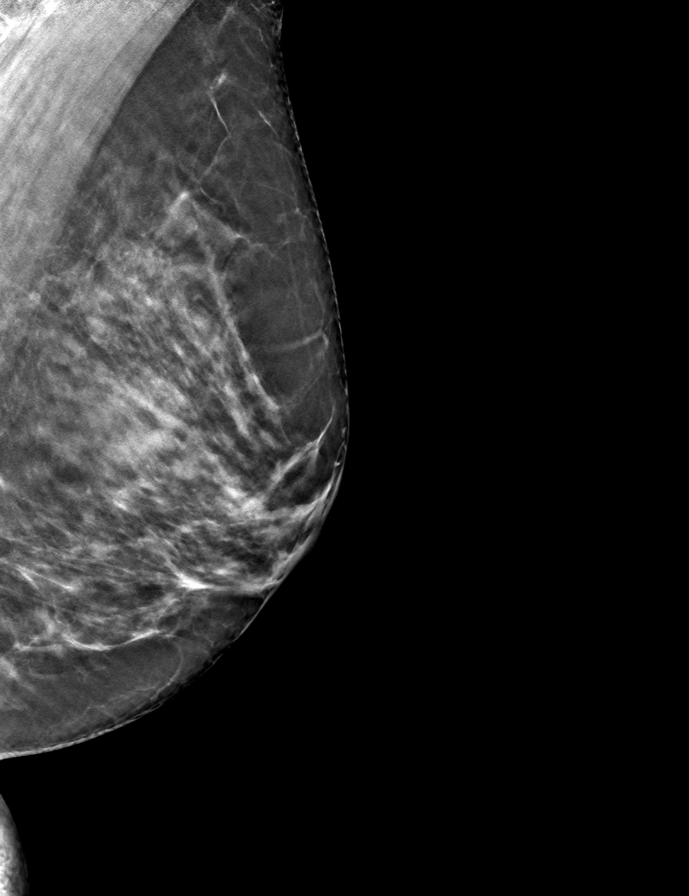

[9 of 24 positions shown; findings below may reference images not displayed]

ACR Breast Density Category c: The breast tissue is heterogeneously
dense, which may obscure small masses.
FINDINGS: There are no findings suspicious for malignancy.
IMPRESSION: No mammographic evidence of malignancy. A result letter of this
screening mammogram will be mailed directly to the patient.

RECOMMENDATION:
Screening mammogram in one year. (Code:Q3-W-BC3)

BI-RADS CATEGORY  1: Negative.

## 2023-08-06 ENCOUNTER — Other Ambulatory Visit: Payer: Self-pay | Admitting: Family Medicine

## 2023-08-06 DIAGNOSIS — Z1231 Encounter for screening mammogram for malignant neoplasm of breast: Secondary | ICD-10-CM

## 2023-08-24 ENCOUNTER — Ambulatory Visit
Admission: RE | Admit: 2023-08-24 | Discharge: 2023-08-24 | Disposition: A | Discharge status: Home / Self Care | Source: Ambulatory Visit | Attending: Family Medicine | Admitting: Family Medicine

## 2023-08-24 DIAGNOSIS — Z1231 Encounter for screening mammogram for malignant neoplasm of breast: Secondary | ICD-10-CM
